# Patient Record
Sex: Female | Born: 1975 | Race: White | Hispanic: No | Marital: Married | Smoking: Never smoker
Health system: Southern US, Community
[De-identification: ages and names within clinical notes are randomized; demographics above are authoritative.]

## PROBLEM LIST (undated history)

## (undated) DIAGNOSIS — K5190 Ulcerative colitis, unspecified, without complications: Secondary | ICD-10-CM

---

## 2014-10-22 ENCOUNTER — Ambulatory Visit
Admission: EM | Admit: 2014-10-22 | Discharge: 2014-10-22 | Disposition: A | Discharge status: Home / Self Care | Attending: Internal Medicine | Admitting: Internal Medicine

## 2014-10-22 DIAGNOSIS — S39012A Strain of muscle, fascia and tendon of lower back, initial encounter: Secondary | ICD-10-CM | POA: Diagnosis not present

## 2014-10-22 DIAGNOSIS — W19XXXA Unspecified fall, initial encounter: Secondary | ICD-10-CM | POA: Diagnosis not present

## 2014-10-22 DIAGNOSIS — Y92009 Unspecified place in unspecified non-institutional (private) residence as the place of occurrence of the external cause: Secondary | ICD-10-CM

## 2014-10-22 DIAGNOSIS — T148 Other injury of unspecified body region: Secondary | ICD-10-CM

## 2014-10-22 DIAGNOSIS — Y92099 Unspecified place in other non-institutional residence as the place of occurrence of the external cause: Secondary | ICD-10-CM

## 2014-10-22 DIAGNOSIS — T148XXA Other injury of unspecified body region, initial encounter: Secondary | ICD-10-CM

## 2014-10-22 DIAGNOSIS — H109 Unspecified conjunctivitis: Secondary | ICD-10-CM | POA: Diagnosis not present

## 2014-10-22 HISTORY — DX: Ulcerative colitis, unspecified, without complications: K51.90

## 2014-10-22 MED ORDER — CYCLOBENZAPRINE HCL 5 MG PO TABS: 5.0000 mg | ORAL_TABLET | Freq: Three times a day (TID) | ORAL | Status: DC | PRN

## 2014-10-22 MED ORDER — ERYTHROMYCIN 5 MG/GM OP OINT: TOPICAL_OINTMENT | OPHTHALMIC | Status: DC

## 2014-10-22 MED ORDER — ACETAMINOPHEN 500 MG PO TABS: 1000.0000 mg | ORAL_TABLET | Freq: Four times a day (QID) | ORAL | Status: AC | PRN

## 2014-10-22 MED ORDER — KETOTIFEN FUMARATE 0.025 % OP SOLN: 1.0000 [drp] | Freq: Two times a day (BID) | OPHTHALMIC | Status: DC

## 2014-10-22 NOTE — Discharge Instructions (Signed)
Contusion A contusion is a deep bruise. Contusions are the result of an injury that caused bleeding under the skin. The contusion may turn blue, purple, or yellow. Minor injuries will give you a painless contusion, but more severe contusions may stay painful and swollen for a few weeks.  CAUSES  A contusion is usually caused by a blow, trauma, or direct force to an area of the body. SYMPTOMS  Swelling and redness of the injured area. Bruising of the injured area. Tenderness and soreness of the injured area. Pain. DIAGNOSIS  The diagnosis can be made by taking a history and physical exam. An X-ray, CT scan, or MRI may be needed to determine if there were any associated injuries, such as fractures. TREATMENT  Specific treatment will depend on what area of the body was injured. In general, the best treatment for a contusion is resting, icing, elevating, and applying cold compresses to the injured area. Over-the-counter medicines may also be recommended for pain control. Ask your caregiver what the best treatment is for your contusion. HOME CARE INSTRUCTIONS  Put ice on the injured area. Put ice in a plastic bag. Place a towel between your skin and the bag. Leave the ice on for 15-20 minutes, 3-4 times a day, or as directed by your health care provider. Only take over-the-counter or prescription medicines for pain, discomfort, or fever as directed by your caregiver. Your caregiver may recommend avoiding anti-inflammatory medicines (aspirin, ibuprofen, and naproxen) for 48 hours because these medicines may increase bruising. Rest the injured area. If possible, elevate the injured area to reduce swelling. SEEK IMMEDIATE MEDICAL CARE IF:  You have increased bruising or swelling. You have pain that is getting worse. Your swelling or pain is not relieved with medicines. MAKE SURE YOU:  Understand these instructions. Will watch your condition. Will get help right away if you are not doing well or  get worse. Document Released: 01/07/2005 Document Revised: 04/04/2013 Document Reviewed: 02/02/2011 [facility] Patient Information 2015 [place], [place]. This information is not intended to replace advice given to you by your health care provider. Make sure you discuss any questions you have with your health care provider. Glaucoma Glaucoma is a condition of high pressure inside the eyes. There are several forms of glaucoma. The pressure in the eye is raised because fluid (aqueous) within the eye can not get out through the normal drainage system. Below are the different forms of glaucoma.  Chronic open-angle glaucoma. This is when the fluid in the eye cannot get through the drainage system. It usually affects both eyes. The best way to understand chronic open angle glaucoma is to think of the tires on your car. When they are overinflated with air, the driver is not aware that there is a problem. However, the rubber on the tires is being worn out faster than normal. Similarly, chronic high pressure in the eyes causes the nerves of the retina and optic nerve to be damaged. When this happens slowly and over time, there are no symptoms.  Closed angle glaucoma or acute angle closure glaucoma. This is when the fluid in the eye cannot get to the drainage system. The pressure in the eye rises very high (3 to 4 times normal). This causes extreme pain and vision loss in the affected eye. Attacks are sudden and often happen in one eye at a time. However, they can happen in both at the same time. If there is an attack of acute angle closure in one eye, the risk  of an attack in the other eye is very high.  Primary glaucoma. This term is used when glaucoma is not caused by another disease. CAUSES  Chronic Open Angle Glaucoma This form is not usually related to other diseases (primary). However, there are forms of this type of glaucoma that are related to other diseases (secondary) including:  Congenital glaucoma.  This means that you are born with the disease.  Certain diseases of the eye (inflammation, some types of cataracts and lens changes, having too much pigment in the eyes, certain diseases of the cornea and tumors of the eye).  Certain drugs and eye drops.  Traumatic injuries.  Abnormal blood vessels that form as a result of other diseases, especially diabetes.  Certain other diseases in the body. Acute Angle Closure Glaucoma   Farsightedness (hyperopia). The eyes are usually shorter in length and have a narrower anatomic opening to the drainage system inside the eye.  Drugs that cause dilation of the pupils as a side affect. Always check the label of drugs for warnings about the risk of glaucoma with their use.  Certain eye drops that can enlarge (dilate) the pupils. This is true even with drops used in the doctor's office to look at your eyes. SYMPTOMS  Chronic Open Angle Glaucoma Early in the disease there are no symptoms at all. If left untreated, the disease will get worse. The following symptoms may happen:   Loss of side vision - usually near the nose. This symptom is rarely noticed by people with chronic open angle glaucoma. By the time this happens, the disease is usually well advanced.  Steady loss of side vision in all directions progressing to "tunnel vision." This is like looking through a toilet roll tube and only being able to see what is in the center of whatever you are looking at.  Eventual total loss of vision in the affected eye(s). Acute Angle Closure Glaucoma  Severe pain in the affected eye associated with clouded vision.  Severe headache in the area around the eye.  Feeling sick to your stomach (nausea) and throwing up (vomiting). DIAGNOSIS  Primary Chronic Open Angle Glaucoma  This form of glaucoma has no symptoms until very late in the disease. Therefore, it is detected only during an eye exam by an eye specialist. It is important to have your eyes looked  at by a specialist at least once a year after the age of 74. If the pressure in the eye is high, it does not necessarily mean that there is glaucoma. In such cases, an ophthalmologist may choose to follow the eye pressures carefully over a period of time. They may not begin treatment until other signs of actual damage appear.  Some of the secondary open angle glaucomas do cause symptoms. In congenital glaucoma for instance, newborn babies' eyes can be enlarged. The baby may cry all of the time due to the pain of the increased pressure.  Acute Angle Closure Glaucoma  People with this type of glaucoma need to get help right away when the attack comes on because it is so painful. The diagnosis is made by an eye exam by an ophthalmologist. He or she will measure the pressure in the affected eye(s). TREATMENT  Primary Chronic Open Angle Glaucoma   Eye drops and medicine by mouth (oral), alone or in combination, may be given. This depends on how bad the disease is and the degree of damage.  Laser treatments may help in more severe cases.  Surgery  may be needed. Acute Angle Closure Glaucoma is treated very aggressively. The longer the attack lasts, the greater the chance of permanent vision loss.   Generally, it is important to get the pressure under control within 24 hours. This is to avoid permanent damage to the affected eye.  Powerful eye drop medication may be used as often as every 10 minutes.  Medicine given by mouth, injection, or through the vein (intravenously). These medicines are used to both open the blockage to the eye's drainage system and to lessen the production of the fluid inside the eye.  After pressure is controlled and to avoid future attacks in both the same eye and the unaffected eye, surgery is needed to make a permanent small opening in the colored portion of the eye (iris). This allows the aqueous fluid to have lasting access to the eye's internal drainage system. This surgery  can be done in the hospital, or it can be performed in an outpatient setting using a laser. SEEK MEDICAL CARE IF:   You are over 61 years of age and have never had your eye pressure measured.  You have sudden, severe pain in one eye associated with drop in vision.  You have a headache, feel sick to your stomach (nausea), and throw up (vomit). SEEK IMMEDIATE MEDICAL CARE IF:  You develop severe pain in the affected eye.  You have problems with your vision.  You have a bad headache in the area around the eye.  You develop nausea and vomiting.  You have the same or similar symptoms in the other eye. Document Released: 03/30/2005 Document Revised: 08/14/2013 Document Reviewed: 11/17/2007 [facility] Patient Information 2015 [place], [place]. This information is not intended to replace advice given to you by your health care provider. Make sure you discuss any questions you have with your health care provider. llergic Conjunctivitis The conjunctiva is a thin membrane that covers the visible white part of the eyeball and the underside of the eyelids. This membrane protects and lubricates the eye. The membrane has small blood vessels running through it that can normally be seen. When the conjunctiva becomes inflamed, the condition is called conjunctivitis. In response to the inflammation, the conjunctival blood vessels become swollen. The swelling results in redness in the normally white part of the eye. The blood vessels of this membrane also react when a person has allergies and is then called allergic conjunctivitis. This condition usually lasts for as long as the allergy persists. Allergic conjunctivitis cannot be passed to another person (non-contagious). The likelihood of bacterial infection is great and the cause is not likely due to allergies if the inflamed eye has:  A sticky discharge.  Discharge or sticking together of the lids in the morning.  Scaling or flaking of the eyelids where the  eyelashes come out.  Red swollen eyelids. CAUSES   Viruses.  Irritants such as foreign bodies.  Chemicals.  General allergic reactions.  Inflammation or serious diseases in the inside or the outside of the eye or the orbit (the boney cavity in which the eye sits) can cause a "red eye." SYMPTOMS   Eye redness.  Tearing.  Itchy eyes.  Burning feeling in the eyes.  Clear drainage from the eye.  Allergic reaction due to pollens or ragweed sensitivity. Seasonal allergic conjunctivitis is frequent in the spring when pollens are in the air and in the fall. DIAGNOSIS  This condition, in its many forms, is usually diagnosed based on the history and an ophthalmological exam.  It usually involves both eyes. If your eyes react at the same time every year, allergies may be the cause. While most "red eyes" are due to allergy or an infection, the role of an eye (ophthalmological) exam is important. The exam can rule out serious diseases of the eye or orbit. TREATMENT   Non-antibiotic eye drops, ointments, or medications by mouth may be prescribed if the ophthalmologist is sure the conjunctivitis is due to allergies alone.  Over-the-counter drops and ointments for allergic symptoms should be used only after other causes of conjunctivitis have been ruled out, or as your caregiver suggests. Medications by mouth are often prescribed if other allergy-related symptoms are present. If the ophthalmologist is sure that the conjunctivitis is due to allergies alone, treatment is normally limited to drops or ointments to reduce itching and burning. HOME CARE INSTRUCTIONS   Wash hands before and after applying drops or ointments, or touching the inflamed eye(s) or eyelids.  Do not let the eye dropper tip or ointment tube touch the eyelid when putting medicine in your eye.  Stop using your soft contact lenses and throw them away. Use a new pair of lenses when recovery is complete. You should run through  sterilizing cycles at least three times before use after complete recovery if the old soft contact lenses are to be used. Hard contact lenses should be stopped. They need to be thoroughly sterilized before use after recovery.  Itching and burning eyes due to allergies is often relieved by using a cool cloth applied to closed eye(s). SEEK MEDICAL CARE IF:   Your problems do not go away after two or three days of treatment.  Your lids are sticky (especially in the morning when you wake up) or stick together.  Discharge develops. Antibiotics may be needed either as drops, ointment, or by mouth.  You have extreme light sensitivity.  An oral temperature above 102 F (38.9 C) develops.  Pain in or around the eye or any other visual symptom develops. MAKE SURE YOU:   Understand these instructions.  Will watch your condition.  Will get help right away if you are not doing well or get worse. Document Released: 06/20/2002 Document Revised: 06/22/2011 Document Reviewed: 05/16/2007 [facility] Patient Information 2015 [place], [place]. This information is not intended to replace advice given to you by your health care provider. Make sure you discuss any questions you have with your health care provider. Low Back Strain with Rehab A strain is an injury in which a tendon or muscle is torn. The muscles and tendons of the lower back are vulnerable to strains. However, these muscles and tendons are very strong and require a great force to be injured. Strains are classified into three categories. Grade 1 strains cause pain, but the tendon is not lengthened. Grade 2 strains include a lengthened ligament, due to the ligament being stretched or partially ruptured. With grade 2 strains there is still function, although the function may be decreased. Grade 3 strains involve a complete tear of the tendon or muscle, and function is usually impaired. SYMPTOMS   Pain in the lower back.  Pain that affects one side  more than the other.  Pain that gets worse with movement and may be felt in the hip, buttocks, or back of the thigh.  Muscle spasms of the muscles in the back.  Swelling along the muscles of the back.  Loss of strength of the back muscles.  Crackling sound (crepitation) when the muscles are touched. CAUSES  Lower back strains occur when a force is placed on the muscles or tendons that is greater than they can handle. Common causes of injury include:  Prolonged overuse of the muscle-tendon units in the lower back, usually from incorrect posture.  A single violent injury or force applied to the back. RISK INCREASES WITH:  Sports that involve twisting forces on the spine or a lot of bending at the waist (football, rugby, weightlifting, bowling, golf, tennis, speed skating, racquetball, swimming, running, gymnastics, diving).  Poor strength and flexibility.  Failure to warm up properly before activity.  Family history of lower back pain or disk disorders.  Previous back injury or surgery (especially fusion).  Poor posture with lifting, especially heavy objects.  Prolonged sitting, especially with poor posture. PREVENTION   Learn and use proper posture when sitting or lifting (maintain proper posture when sitting, lift using the knees and legs, not at the waist).  Warm up and stretch properly before activity.  Allow for adequate recovery between workouts.  Maintain physical fitness:  Strength, flexibility, and endurance.  Cardiovascular fitness. PROGNOSIS  If treated properly, lower back strains usually heal within 6 weeks. RELATED COMPLICATIONS   Recurring symptoms, resulting in a chronic problem.  Chronic inflammation, scarring, and partial muscle-tendon tear.  Delayed healing or resolution of symptoms.  Prolonged disability. TREATMENT  Treatment first involves the use of ice and medicine, to reduce pain and inflammation. The use of strengthening and stretching  exercises may help reduce pain with activity. These exercises may be performed at home or with a therapist. Severe injuries may require referral to a therapist for further evaluation and treatment, such as ultrasound. Your caregiver may advise that you wear a back brace or corset, to help reduce pain and discomfort. Often, prolonged bed rest results in greater harm then benefit. Corticosteroid injections may be recommended. However, these should be reserved for the most serious cases. It is important to avoid using your back when lifting objects. At night, sleep on your back on a firm mattress with a pillow placed under your knees. If non-surgical treatment is unsuccessful, surgery may be needed.  MEDICATION   If pain medicine is needed, nonsteroidal anti-inflammatory medicines (aspirin and ibuprofen), or other minor pain relievers (acetaminophen), are often advised.  Do not take pain medicine for 7 days before surgery.  Prescription pain relievers may be given, if your caregiver thinks they are needed. Use only as directed and only as much as you need.  Ointments applied to the skin may be helpful.  Corticosteroid injections may be given by your caregiver. These injections should be reserved for the most serious cases, because they may only be given a certain number of times. HEAT AND COLD  Cold treatment (icing) should be applied for 10 to 15 minutes every 2 to 3 hours for inflammation and pain, and immediately after activity that aggravates your symptoms. Use ice packs or an ice massage.  Heat treatment may be used before performing stretching and strengthening activities prescribed by your caregiver, physical therapist, or athletic trainer. Use a heat pack or a warm water soak. SEEK MEDICAL CARE IF:   Symptoms get worse or do not improve in 2 to 4 weeks, despite treatment.  You develop numbness, weakness, or loss of bowel or bladder function.  New, unexplained symptoms develop. (Drugs used  in treatment may produce side effects.) EXERCISES  RANGE OF MOTION (ROM) AND STRETCHING EXERCISES - Low Back Strain Most people with lower back pain will find that  their symptoms get worse with excessive bending forward (flexion) or arching at the lower back (extension). The exercises which will help resolve your symptoms will focus on the opposite motion.  Your physician, physical therapist or athletic trainer will help you determine which exercises will be most helpful to resolve your lower back pain. Do not complete any exercises without first consulting with your caregiver. Discontinue any exercises which make your symptoms worse until you speak to your caregiver.  If you have pain, numbness or tingling which travels down into your buttocks, leg or foot, the goal of the therapy is for these symptoms to move closer to your back and eventually resolve. Sometimes, these leg symptoms will get better, but your lower back pain may worsen. This is typically an indication of progress in your rehabilitation. Be very alert to any changes in your symptoms and the activities in which you participated in the 24 hours prior to the change. Sharing this information with your caregiver will allow him/her to most efficiently treat your condition.  These exercises may help you when beginning to rehabilitate your injury. Your symptoms may resolve with or without further involvement from your physician, physical therapist or athletic trainer. While completing these exercises, remember:  Restoring tissue flexibility helps normal motion to return to the joints. This allows healthier, less painful movement and activity.  An effective stretch should be held for at least 30 seconds.  A stretch should never be painful. You should only feel a gentle lengthening or release in the stretched tissue. FLEXION RANGE OF MOTION AND STRETCHING EXERCISES: STRETCH - Flexion, Single Knee to Chest   Lie on a firm bed or floor with both  legs extended in front of you.  Keeping one leg in contact with the floor, bring your opposite knee to your chest. Hold your leg in place by either grabbing behind your thigh or at your knee.  Pull until you feel a gentle stretch in your lower back. Hold __________ seconds.  Slowly release your grasp and repeat the exercise with the opposite side. Repeat __________ times. Complete this exercise __________ times per day.  STRETCH - Flexion, Double Knee to Chest   Lie on a firm bed or floor with both legs extended in front of you.  Keeping one leg in contact with the floor, bring your opposite knee to your chest.  Tense your stomach muscles to support your back and then lift your other knee to your chest. Hold your legs in place by either grabbing behind your thighs or at your knees.  Pull both knees toward your chest until you feel a gentle stretch in your lower back. Hold __________ seconds.  Tense your stomach muscles and slowly return one leg at a time to the floor. Repeat __________ times. Complete this exercise __________ times per day.  STRETCH - Low Trunk Rotation  Lie on a firm bed or floor. Keeping your legs in front of you, bend your knees so they are both pointed toward the ceiling and your feet are flat on the floor.  Extend your arms out to the side. This will stabilize your upper body by keeping your shoulders in contact with the floor.  Gently and slowly drop both knees together to one side until you feel a gentle stretch in your lower back. Hold for __________ seconds.  Tense your stomach muscles to support your lower back as you bring your knees back to the starting position. Repeat the exercise to the other side. Repeat  __________ times. Complete this exercise __________ times per day  EXTENSION RANGE OF MOTION AND FLEXIBILITY EXERCISES: STRETCH - Extension, Prone on Elbows   Lie on your stomach on the floor, a bed will be too soft. Place your palms about shoulder  width apart and at the height of your head.  Place your elbows under your shoulders. If this is too painful, stack pillows under your chest.  Allow your body to relax so that your hips drop lower and make contact more completely with the floor.  Hold this position for __________ seconds.  Slowly return to lying flat on the floor. Repeat __________ times. Complete this exercise __________ times per day.  RANGE OF MOTION - Extension, Prone Press Ups  Lie on your stomach on the floor, a bed will be too soft. Place your palms about shoulder width apart and at the height of your head.  Keeping your back as relaxed as possible, slowly straighten your elbows while keeping your hips on the floor. You may adjust the placement of your hands to maximize your comfort. As you gain motion, your hands will come more underneath your shoulders.  Hold this position __________ seconds.  Slowly return to lying flat on the floor. Repeat __________ times. Complete this exercise __________ times per day.  RANGE OF MOTION- Quadruped, Neutral Spine   Assume a hands and knees position on a firm surface. Keep your hands under your shoulders and your knees under your hips. You may place padding under your knees for comfort.  Drop your head and point your tail bone toward the ground below you. This will round out your lower back like an angry cat. Hold this position for __________ seconds.  Slowly lift your head and release your tail bone so that your back sags into a large arch, like an old horse.  Hold this position for __________ seconds.  Repeat this until you feel limber in your lower back.  Now, find your "sweet spot." This will be the most comfortable position somewhere between the two previous positions. This is your neutral spine. Once you have found this position, tense your stomach muscles to support your lower back.  Hold this position for __________ seconds. Repeat __________ times. Complete this  exercise __________ times per day.  STRENGTHENING EXERCISES - Low Back Strain These exercises may help you when beginning to rehabilitate your injury. These exercises should be done near your "sweet spot." This is the neutral, low-back arch, somewhere between fully rounded and fully arched, that is your least painful position. When performed in this safe range of motion, these exercises can be used for people who have either a flexion or extension based injury. These exercises may resolve your symptoms with or without further involvement from your physician, physical therapist or athletic trainer. While completing these exercises, remember:   Muscles can gain both the endurance and the strength needed for everyday activities through controlled exercises.  Complete these exercises as instructed by your physician, physical therapist or athletic trainer. Increase the resistance and repetitions only as guided.  You may experience muscle soreness or fatigue, but the pain or discomfort you are trying to eliminate should never worsen during these exercises. If this pain does worsen, stop and make certain you are following the directions exactly. If the pain is still present after adjustments, discontinue the exercise until you can discuss the trouble with your caregiver. STRENGTHENING - Deep Abdominals, Pelvic Tilt  Lie on a firm bed or floor. Keeping your legs  in front of you, bend your knees so they are both pointed toward the ceiling and your feet are flat on the floor.  Tense your lower abdominal muscles to press your lower back into the floor. This motion will rotate your pelvis so that your tail bone is scooping upwards rather than pointing at your feet or into the floor.  With a gentle tension and even breathing, hold this position for __________ seconds. Repeat __________ times. Complete this exercise __________ times per day.  STRENGTHENING - Abdominals, Crunches   Lie on a firm bed or floor.  Keeping your legs in front of you, bend your knees so they are both pointed toward the ceiling and your feet are flat on the floor. Cross your arms over your chest.  Slightly tip your chin down without bending your neck.  Tense your abdominals and slowly lift your trunk high enough to just clear your shoulder blades. Lifting higher can put excessive stress on the lower back and does not further strengthen your abdominal muscles.  Control your return to the starting position. Repeat __________ times. Complete this exercise __________ times per day.  STRENGTHENING - Quadruped, Opposite UE/LE Lift   Assume a hands and knees position on a firm surface. Keep your hands under your shoulders and your knees under your hips. You may place padding under your knees for comfort.  Find your neutral spine and gently tense your abdominal muscles so that you can maintain this position. Your shoulders and hips should form a rectangle that is parallel with the floor and is not twisted.  Keeping your trunk steady, lift your right hand no higher than your shoulder and then your left leg no higher than your hip. Make sure you are not holding your breath. Hold this position __________ seconds.  Continuing to keep your abdominal muscles tense and your back steady, slowly return to your starting position. Repeat with the opposite arm and leg. Repeat __________ times. Complete this exercise __________ times per day.  STRENGTHENING - Lower Abdominals, Double Knee Lift  Lie on a firm bed or floor. Keeping your legs in front of you, bend your knees so they are both pointed toward the ceiling and your feet are flat on the floor.  Tense your abdominal muscles to brace your lower back and slowly lift both of your knees until they come over your hips. Be certain not to hold your breath.  Hold __________ seconds. Using your abdominal muscles, return to the starting position in a slow and controlled manner. Repeat __________  times. Complete this exercise __________ times per day.  POSTURE AND BODY MECHANICS CONSIDERATIONS - Low Back Strain Keeping correct posture when sitting, standing or completing your activities will reduce the stress put on different body tissues, allowing injured tissues a chance to heal and limiting painful experiences. The following are general guidelines for improved posture. Your physician or physical therapist will provide you with any instructions specific to your needs. While reading these guidelines, remember:  The exercises prescribed by your provider will help you have the flexibility and strength to maintain correct postures.  The correct posture provides the best environment for your joints to work. All of your joints have less wear and tear when properly supported by a spine with good posture. This means you will experience a healthier, less painful body.  Correct posture must be practiced with all of your activities, especially prolonged sitting and standing. Correct posture is as important when doing repetitive low-stress activities (typing)  as it is when doing a single heavy-load activity (lifting). RESTING POSITIONS Consider which positions are most painful for you when choosing a resting position. If you have pain with flexion-based activities (sitting, bending, stooping, squatting), choose a position that allows you to rest in a less flexed posture. You would want to avoid curling into a fetal position on your side. If your pain worsens with extension-based activities (prolonged standing, working overhead), avoid resting in an extended position such as sleeping on your stomach. Most people will find more comfort when they rest with their spine in a more neutral position, neither too rounded nor too arched. Lying on a non-sagging bed on your side with a pillow between your knees, or on your back with a pillow under your knees will often provide some relief. Keep in mind, being in any one  position for a prolonged period of time, no matter how correct your posture, can still lead to stiffness. PROPER SITTING POSTURE In order to minimize stress and discomfort on your spine, you must sit with correct posture. Sitting with good posture should be effortless for a healthy body. Returning to good posture is a gradual process. Many people can work toward this most comfortably by using various supports until they have the flexibility and strength to maintain this posture on their own. When sitting with proper posture, your ears will fall over your shoulders and your shoulders will fall over your hips. You should use the back of the chair to support your upper back. Your lower back will be in a neutral position, just slightly arched. You may place a small pillow or folded towel at the base of your lower back for support.  When working at a desk, create an environment that supports good, upright posture. Without extra support, muscles tire, which leads to excessive strain on joints and other tissues. Keep these recommendations in mind: CHAIR:  A chair should be able to slide under your desk when your back makes contact with the back of the chair. This allows you to work closely.  The chair's height should allow your eyes to be level with the upper part of your monitor and your hands to be slightly lower than your elbows. BODY POSITION  Your feet should make contact with the floor. If this is not possible, use a foot rest.  Keep your ears over your shoulders. This will reduce stress on your neck and lower back. INCORRECT SITTING POSTURES  If you are feeling tired and unable to assume a healthy sitting posture, do not slouch or slump. This puts excessive strain on your back tissues, causing more damage and pain. Healthier options include:  Using more support, like a lumbar pillow.  Switching tasks to something that requires you to be upright or walking.  Talking a brief walk.  Lying down  to rest in a neutral-spine position. PROLONGED STANDING WHILE SLIGHTLY LEANING FORWARD  When completing a task that requires you to lean forward while standing in one place for a long time, place either foot up on a stationary 2-4 inch high object to help maintain the best posture. When both feet are on the ground, the lower back tends to lose its slight inward curve. If this curve flattens (or becomes too large), then the back and your other joints will experience too much stress, tire more quickly, and can cause pain. CORRECT STANDING POSTURES Proper standing posture should be assumed with all daily activities, even if they only take a few moments,  like when brushing your teeth. As in sitting, your ears should fall over your shoulders and your shoulders should fall over your hips. You should keep a slight tension in your abdominal muscles to brace your spine. Your tailbone should point down to the ground, not behind your body, resulting in an over-extended swayback posture.  INCORRECT STANDING POSTURES  Common incorrect standing postures include a forward head, locked knees and/or an excessive swayback. WALKING Walk with an upright posture. Your ears, shoulders and hips should all line-up. PROLONGED ACTIVITY IN A FLEXED POSITION When completing a task that requires you to bend forward at your waist or lean over a low surface, try to find a way to stabilize 3 out of 4 of your limbs. You can place a hand or elbow on your thigh or rest a knee on the surface you are reaching across. This will provide you more stability so that your muscles do not fatigue as quickly. By keeping your knees relaxed, or slightly bent, you will also reduce stress across your lower back. CORRECT LIFTING TECHNIQUES DO :   Assume a wide stance. This will provide you more stability and the opportunity to get as close as possible to the object which you are lifting.  Tense your abdominals to brace your spine. Bend at the knees  and hips. Keeping your back locked in a neutral-spine position, lift using your leg muscles. Lift with your legs, keeping your back straight.  Test the weight of unknown objects before attempting to lift them.  Try to keep your elbows locked down at your sides in order get the best strength from your shoulders when carrying an object.  Always ask for help when lifting heavy or awkward objects. INCORRECT LIFTING TECHNIQUES DO NOT:   Lock your knees when lifting, even if it is a small object.  Bend and twist. Pivot at your feet or move your feet when needing to change directions.  Assume that you can safely pick up even a paper clip without proper posture. Document Released: 03/30/2005 Document Revised: 06/22/2011 Document Reviewed: 07/12/2008 [facility] Patient Information 2015 [place], [place]. This information is not intended to replace advice given to you by your health care provider. Make sure you discuss any questions you have with your health care provider.

## 2014-10-22 NOTE — ED Provider Notes (Signed)
CSN: [id number]     Arrival date & time 10/22/14  4098 History   First MD Initiated Contact with Patient 10/22/14 1031     Chief Complaint  Patient presents with  . Eye Pain   (Consider location/radiation/quality/duration/timing/severity/associated sxs/prior Treatment) HPI Comments: Caucasian female moving and unpacking at new home tripped and fell onto boxes yesterday having lower right back pain.  Denied loss of bowel/bladder control, saddle paresthesias, leg/arm weakness.  Has also had intermittent right eyeball discomfort restarted last night denied vision changes has seen optometry in past year and exam was normal  Patient is a 39 y.o. female presenting with eye pain. The history is provided by the patient.  Eye Pain This is a new problem. The current episode started more than 2 days ago. The problem occurs constantly. The problem has not changed since onset.Pertinent negatives include no chest pain, no abdominal pain, no headaches and no shortness of breath. The symptoms are aggravated by exertion. Nothing relieves the symptoms. She has tried a cold compress, rest, food and water for the symptoms. The treatment provided no relief.    Past Medical History  Diagnosis Date  . Ulcerative colitis    History reviewed. No pertinent past surgical history. History reviewed. No pertinent family history. History  Substance Use Topics  . Smoking status: Never Smoker   . Smokeless tobacco: Not on file  . Alcohol Use: No   OB History    No data available     Review of Systems  Constitutional: Negative for fever, chills, diaphoresis, activity change, appetite change and fatigue.  HENT: Negative for congestion, drooling, ear discharge, ear pain, facial swelling, mouth sores, nosebleeds, postnasal drip, rhinorrhea, sinus pressure, sneezing, sore throat, tinnitus, trouble swallowing and voice change.   Eyes: Positive for pain. Negative for photophobia, discharge, redness, itching and visual  disturbance.  Respiratory: Negative for cough, shortness of breath, wheezing and stridor.   Cardiovascular: Negative for chest pain, palpitations and leg swelling.  Gastrointestinal: Negative for nausea, vomiting, abdominal pain, diarrhea, constipation and blood in stool.  Endocrine: Negative for cold intolerance and heat intolerance.  Genitourinary: Negative for dysuria, hematuria and difficulty urinating.  Musculoskeletal: Positive for myalgias and back pain. Negative for joint swelling, arthralgias, gait problem, neck pain and neck stiffness.  Skin: Positive for color change. Negative for pallor, rash and wound.  Allergic/Immunologic: Positive for environmental allergies. Negative for food allergies.  Neurological: Negative for dizziness, tremors, seizures, syncope, facial asymmetry, speech difficulty, weakness, light-headedness, numbness and headaches.  Hematological: Negative for adenopathy. Does not bruise/bleed easily.  Psychiatric/Behavioral: Negative for behavioral problems, confusion, sleep disturbance and agitation.    Allergies  Review of patient's allergies indicates no known allergies.  Home Medications   Prior to Admission medications   Medication Sig Start Date End Date Taking? Authorizing Provider  Mesalamine (LIALDA PO) Take by mouth.   Yes Historical Provider, MD  predniSONE (DELTASONE) 20 MG tablet Take 20 mg by mouth daily with breakfast.   Yes Historical Provider, MD  acetaminophen (TYLENOL) 500 MG tablet Take 2 tablets (1,000 mg total) by mouth every 6 (six) hours as needed for moderate pain. 10/22/14   Barbaraann Barthel, NP  cyclobenzaprine (FLEXERIL) 5 MG tablet Take 1 tablet (5 mg total) by mouth 3 (three) times daily as needed for muscle spasms. 10/22/14   Barbaraann Barthel, NP  erythromycin ophthalmic ointment Place a 1/2 inch ribbon of ointment into the lower eyelid right eye BID 10/22/14   Barbaraann Barthel, NP  ketotifen (ZADITOR) 0.025 % ophthalmic solution  Place 1 drop into the right eye 2 (two) times daily. 10/22/14   Jarold Songina A Nohely Whitehorn, NP   BP 122/79 mmHg  Pulse 75  Temp(Src) 98.4 F (36.9 C) (Tympanic)  Resp 18  Ht 5\' 7"  (1.702 m)  Wt 160 lb (72.576 kg)  BMI 25.05 kg/m2  SpO2 100% Physical Exam  Constitutional: She is oriented to person, place, and time. Vital signs are normal. She appears well-developed and well-nourished. No distress.  HENT:  Head: Normocephalic and atraumatic.  Right Ear: Hearing, external ear and ear canal normal. A middle ear effusion is present.  Left Ear: Hearing, external ear and ear canal normal. A middle ear effusion is present.  Nose: Mucosal edema and rhinorrhea present. Right sinus exhibits no maxillary sinus tenderness and no frontal sinus tenderness. Left sinus exhibits no maxillary sinus tenderness and no frontal sinus tenderness.  Mouth/Throat: Uvula is midline and mucous membranes are normal. She does not have dentures. No oral lesions. No trismus in the jaw. Normal dentition. No dental abscesses, uvula swelling, lacerations or dental caries. Posterior oropharyngeal edema and posterior oropharyngeal erythema present. No oropharyngeal exudate or tonsillar abscesses.  Eyes: EOM and lids are normal. Pupils are equal, round, and reactive to light. Right eye exhibits no chemosis, no discharge, no exudate and no hordeolum. No foreign body present in the right eye. Left eye exhibits no chemosis, no discharge, no exudate and no hordeolum. No foreign body present in the left eye. Right conjunctiva is injected. Right conjunctiva has no hemorrhage. Left conjunctiva is injected. Left conjunctiva has no hemorrhage. No scleral icterus. Right eye exhibits normal extraocular motion and no nystagmus. Left eye exhibits normal extraocular motion and no nystagmus. Right pupil is round and reactive. Left pupil is round and reactive. Pupils are equal.  Eyelid conjunctiva 2+/4 injection bilaterally upper and lower eyelids  Neck:  Trachea normal and normal range of motion. Neck supple. No tracheal deviation present. No thyromegaly present.  Cardiovascular: Normal rate, regular rhythm, normal heart sounds and intact distal pulses.  Exam reveals no gallop and no friction rub.   No murmur heard. Pulmonary/Chest: Effort normal and breath sounds normal. No stridor. No respiratory distress. She has no wheezes.  Abdominal: Soft. Bowel sounds are normal. She exhibits no distension and no mass. There is no tenderness. There is no rebound and no guarding.  Musculoskeletal: Normal range of motion. She exhibits tenderness. She exhibits no edema.       Cervical back: She exhibits normal range of motion, no tenderness, no bony tenderness, no swelling, no edema, no deformity, no laceration, no pain, no spasm and normal pulse.       Thoracic back: She exhibits normal range of motion, no tenderness, no bony tenderness, no swelling, no edema, no deformity, no laceration, no pain, no spasm and normal pulse.       Lumbar back: She exhibits tenderness, pain and spasm. She exhibits normal range of motion, no bony tenderness, no swelling, no edema, no deformity, no laceration and normal pulse.  Pain with rotation, lateral bending and flexion right lumbar paraspinals, spasm noted T12; slightly TTP right SI joint also  Lymphadenopathy:    She has no cervical adenopathy.  Neurological: She is alert and oriented to person, place, and time. She has normal reflexes. She displays normal reflexes. No cranial nerve deficit. She exhibits normal muscle tone. Coordination normal.  Skin: Skin is warm, dry and intact. Bruising and ecchymosis noted. No abrasion, no  burn, no laceration, no lesion, no petechiae and no rash noted. She is not diaphoretic. No cyanosis or erythema. No pallor. Nails show no clubbing.     Psychiatric: She has a normal mood and affect. Her speech is normal and behavior is normal. Judgment and thought content normal. Cognition and memory  are normal.  Nursing note and vitals reviewed.   ED Course  Procedures (including critical care time) Labs Review Labs Reviewed - No data to display  Imaging Review No results found.   MDM   1. Conjunctivitis of right eye   2. Low back strain, initial encounter   3. Fall at home, initial encounter   4. Contusion   Cleared for work/daycare/school.  Note given.  Start antihistamine medication.  Shower after allergen exposure prior to bed.  Hygiene discussed. Apply erythromycin opthalmic ointment twice a day for one week.  If no improvement in symptoms after one week of medication schedule follow up with optometry unless swelling noted in eyelids, erythema, changes in vision then go to ER for re-evaluation day that symptoms occur.  Patient to apply warm packs prn as directed.  Instructed patient to not rub eyes.  May use over the counter eye drops/tears for pain/symptom relief.  Return to clinic if headache, fever greater than 100.54F, nausea/vomiting, purulent discharge/matting unable to open eye without using fingers, foreign body sensation, ciliary flush, worsening photophobia or vision.  Call or return to clinic as needed if these symptoms worsen or fail to improve as anticipated.  Patient given [employer] handout on allergic conjunctivitis.  Patient verbalized agreement and understanding of treatment plan.   P2:  Hand washing, avoid rubbing eyes  For acute pain, rest, and intermittent application of heat (do not sleep on heating pad).  I discussed longer-term treatment plan of PRN PO NSAIDS and I discussed a home back care exercise program with a strengthening and flexibility exercise.  Discussed no alcohol consumption if taking flexeril and avoid driving for 8 hours after taking medication. Work excuse for 24 hours.  May take tylenol 1000mg  po QID prn pain.  Discussed motrin or naproxen could worsen ulcerative colitis.  Patient given [employer] handout on back strain and contusion right leg.   Proper avoidance of heavy lifting discussed.  Consider physical therapy or chiropractic care and radiology if not improving.  Call or return to clinic as needed if these symptoms worsen or fail to improve as anticipated especially leg weakness, loss of bowel/bladder control or saddle paresthesias.   Patient verbalized understanding of instructions/information and agreed with plan of care.  P2:  Injury Prevention, fitness    Barbaraann Barthel, NP 10/22/14 1653

## 2014-10-22 NOTE — ED Notes (Signed)
Here for (1) Right eye pain started last night, pain is constant, no redness or discharge, L 20/15, R20/20, Both 20/15.  (2) Back pain, was moving and fell, believe the fall aggravated the back pain

## 2015-01-31 ENCOUNTER — Ambulatory Visit

## 2015-01-31 ENCOUNTER — Ambulatory Visit
Admission: EM | Admit: 2015-01-31 | Discharge: 2015-01-31 | Disposition: A | Discharge status: Home / Self Care | Attending: Family Medicine | Admitting: Family Medicine

## 2015-01-31 ENCOUNTER — Encounter: Payer: Self-pay | Admitting: Emergency Medicine

## 2015-01-31 DIAGNOSIS — R109 Unspecified abdominal pain: Secondary | ICD-10-CM | POA: Diagnosis not present

## 2015-01-31 LAB — COMPREHENSIVE METABOLIC PANEL
ALBUMIN: 4.5 g/dL (ref 3.5–5.0)
ALT: 16 U/L (ref 14–54)
ANION GAP: 8 (ref 5–15)
AST: 16 U/L (ref 15–41)
Alkaline Phosphatase: 82 U/L (ref 38–126)
BILIRUBIN TOTAL: 0.6 mg/dL (ref 0.3–1.2)
BUN: 9 mg/dL (ref 6–20)
CALCIUM: 9.2 mg/dL (ref 8.9–10.3)
CO2: 30 mmol/L (ref 22–32)
Chloride: 101 mmol/L (ref 101–111)
Creatinine, Ser: 0.62 mg/dL (ref 0.44–1.00)
GFR calc Af Amer: >60 mL/min (ref >60)
GFR calc non Af Amer: >60 mL/min (ref >60)
Glucose, Bld: 103 mg/dL — ABNORMAL HIGH (ref 65–99)
Potassium: 4 mmol/L (ref 3.5–5.1)
Sodium: 139 mmol/L (ref 135–145)
TOTAL PROTEIN: 7.8 g/dL (ref 6.5–8.1)

## 2015-01-31 LAB — CBC WITH DIFFERENTIAL/PLATELET
BASOS PCT: 1 %
Basophils Absolute: 0.1 10*3/uL (ref 0–0.1)
Eosinophils Absolute: 0.2 10*3/uL (ref 0–0.7)
Eosinophils Relative: 2 %
HEMATOCRIT: 44.1 % (ref 35.0–47.0)
Hemoglobin: 14.7 g/dL (ref 12.0–16.0)
Lymphocytes Relative: 31 %
Lymphs Abs: 2.4 10*3/uL (ref 1.0–3.6)
MCH: 29.5 pg (ref 26.0–34.0)
MCHC: 33.4 g/dL (ref 32.0–36.0)
MCV: 88.3 fL (ref 80.0–100.0)
MONO ABS: 0.7 10*3/uL (ref 0.2–0.9)
Monocytes Relative: 9 %
Neutro Abs: 4.5 10*3/uL (ref 1.4–6.5)
Neutrophils Relative %: 57 %
Platelets: 275 10*3/uL (ref 150–440)
RBC: 4.99 MIL/uL (ref 3.80–5.20)
RDW: 12.4 % (ref 11.5–14.5)
WBC: 7.7 10*3/uL (ref 3.6–11.0)

## 2015-01-31 LAB — URINALYSIS COMPLETE WITH MICROSCOPIC (ARMC ONLY)
BILIRUBIN URINE: NEGATIVE
Glucose, UA: NEGATIVE mg/dL
Hgb urine dipstick: NEGATIVE
KETONES UR: NEGATIVE mg/dL
LEUKOCYTES UA: NEGATIVE
NITRITE: NEGATIVE
PROTEIN: NEGATIVE mg/dL
WBC, UA: NONE SEEN WBC/hpf (ref <3)
pH: 5.5 (ref 5.0–8.0)

## 2015-01-31 LAB — PREGNANCY, URINE: Preg Test, Ur: NEGATIVE

## 2015-01-31 MED ORDER — HYDROCODONE-ACETAMINOPHEN 5-325 MG PO TABS: 1.0000 | ORAL_TABLET | Freq: Four times a day (QID) | ORAL | Status: DC | PRN

## 2015-01-31 MED ORDER — TAMSULOSIN HCL 0.4 MG PO CAPS: 0.4000 mg | ORAL_CAPSULE | Freq: Every day | ORAL | Status: AC

## 2015-01-31 MED ORDER — ONDANSETRON 8 MG PO TBDP
8.0000 mg | ORAL_TABLET | Freq: Once | ORAL | Status: AC
Start: 2015-01-31 — End: 2015-01-31
  Administered 2015-01-31: 8 mg via ORAL

## 2015-01-31 MED ORDER — ONDANSETRON 8 MG PO TBDP: 8.0000 mg | ORAL_TABLET | Freq: Two times a day (BID) | ORAL | Status: AC

## 2015-01-31 MED ORDER — KETOROLAC TROMETHAMINE 10 MG PO TABS: 10.0000 mg | ORAL_TABLET | Freq: Three times a day (TID) | ORAL | Status: AC | PRN

## 2015-01-31 NOTE — ED Notes (Signed)
Patient c/o right sided back pain since yesterday.  Patient c/o N/V.

## 2015-01-31 NOTE — ED Provider Notes (Signed)
CSN: [id number]     Arrival date & time 01/31/15  1045 History   First MD Initiated Contact with Patient 01/31/15 1144     Chief Complaint  Patient presents with  . Back Pain   (Consider location/radiation/quality/duration/timing/severity/associated sxs/prior Treatment) HPI Comments: Married caucasian female here for evaluation of right flank pain started last night took tylenol 1000mg  po and went to bed pain woke her up in middle of night and she threw up still nauseaus.  History of kidney stones.  Saw GI beginning of the month for her colitis.  Patient reported this feels like her typical kidney stone.  + Headache and dizzyness urine slightly blood tinged per patient  Patient is a 39 y.o. female presenting with back pain. The history is provided by the patient.  Back Pain Quality:  Burning Radiates to:  Does not radiate Pain severity:  Severe Pain is:  Same all the time Onset quality:  Sudden Duration:  18 hours Timing:  Constant Progression:  Unchanged Chronicity:  Recurrent Context: emotional stress and recent illness   Context: not falling, not jumping from heights, not lifting heavy objects, not MCA, not MVA, not occupational injury, not pedestrian accident, not physical stress, not recent injury and not twisting   Relieved by:  Nothing Worsened by:  Movement Ineffective treatments:  Bed rest, being still, lying down, OTC medications and NSAIDs Associated symptoms: headaches   Associated symptoms: no abdominal pain, no abdominal swelling, no bladder incontinence, no bowel incontinence, no chest pain, no dysuria, no fever, no leg pain, no numbness, no paresthesias, no pelvic pain, no perianal numbness, no tingling, no weakness and no weight loss   Risk factors: no hx of cancer, no hx of osteoporosis, no menopause, not obese, not pregnant, no recent surgery and no vascular disease     Past Medical History  Diagnosis Date  . Ulcerative colitis (HCC)    History reviewed. No  pertinent past surgical history. History reviewed. No pertinent family history. Social History  Substance Use Topics  . Smoking status: Never Smoker   . Smokeless tobacco: None  . Alcohol Use: No   OB History    No data available     Review of Systems  Constitutional: Positive for fatigue. Negative for fever, chills, weight loss, diaphoresis, activity change, appetite change and unexpected weight change.  HENT: Negative for congestion, dental problem, drooling, ear discharge, ear pain, facial swelling, hearing loss, mouth sores, nosebleeds, postnasal drip, rhinorrhea, sinus pressure, sneezing, sore throat, tinnitus, trouble swallowing and voice change.   Eyes: Negative for photophobia, pain, discharge, redness, itching and visual disturbance.  Respiratory: Negative for cough, choking, chest tightness, shortness of breath, wheezing and stridor.   Cardiovascular: Negative for chest pain, palpitations and leg swelling.  Gastrointestinal: Negative for nausea, vomiting, abdominal pain, diarrhea, constipation, blood in stool, abdominal distention, rectal pain and bowel incontinence.  Endocrine: Negative for cold intolerance and heat intolerance.  Genitourinary: Positive for hematuria and flank pain. Negative for bladder incontinence, dysuria, urgency, frequency, decreased urine volume, vaginal bleeding, vaginal discharge, enuresis, difficulty urinating, genital sores, vaginal pain, menstrual problem and pelvic pain.  Musculoskeletal: Positive for myalgias and back pain. Negative for joint swelling, arthralgias, gait problem, neck pain and neck stiffness.  Skin: Negative for color change, pallor, rash and wound.  Allergic/Immunologic: Positive for immunocompromised state. Negative for environmental allergies and food allergies.  Neurological: Positive for dizziness and headaches. Negative for tingling, tremors, seizures, syncope, facial asymmetry, speech difficulty, weakness, light-headedness,  numbness and paresthesias.  Hematological: Negative for adenopathy. Does not bruise/bleed easily.  Psychiatric/Behavioral: Positive for sleep disturbance. Negative for behavioral problems, confusion and agitation.    Allergies  Codeine and Tramadol  Home Medications   Prior to Admission medications   Medication Sig Start Date End Date Taking? Authorizing Provider  Adalimumab (HUMIRA) 40 MG/0.8ML PSKT Inject 40 mg into the skin every 21 ( twenty-one) days.   Yes Historical Provider, MD  acetaminophen (TYLENOL) 500 MG tablet Take 2 tablets (1,000 mg total) by mouth every 6 (six) hours as needed for moderate pain. 10/22/14   Barbaraann Barthel, NP  ketorolac (TORADOL) 10 MG tablet Take 1 tablet (10 mg total) by mouth every 8 (eight) hours as needed for severe pain. 01/31/15   Barbaraann Barthel, NP  Mesalamine (LIALDA PO) Take by mouth.    Historical Provider, MD  ondansetron (ZOFRAN-ODT) 8 MG disintegrating tablet Take 1 tablet (8 mg total) by mouth 2 (two) times daily. 01/31/15   Barbaraann Barthel, NP  tamsulosin (FLOMAX) 0.4 MG CAPS capsule Take 1 capsule (0.4 mg total) by mouth daily. 01/31/15   Barbaraann Barthel, NP   Meds Ordered and Administered this Visit   Medications  ondansetron (ZOFRAN-ODT) disintegrating tablet 8 mg (8 mg Oral Given 01/31/15 1145)    BP 115/77 mmHg  Pulse 65  Temp(Src) 98 F (36.7 C) (Tympanic)  Resp 16  Ht 5\' 7"  (1.702 m)  Wt 158 lb (71.668 kg)  BMI 24.74 kg/m2  SpO2 100%  LMP 01/24/2015 (Approximate) Orthostatic VS for the past 24 hrs:  BP- Lying Pulse- Lying BP- Sitting Pulse- Sitting BP- Standing at 0 minutes Pulse- Standing at 0 minutes  01/31/15 1201 113/80 mmHg 63 111/73 mmHg 73 96/72 mmHg 72    Physical Exam  Constitutional: She is oriented to person, place, and time. Vital signs are normal. She appears well-developed and well-nourished. She is active.  Non-toxic appearance. She does not have a sickly appearance. She appears ill. No distress.    HENT:  Head: Normocephalic and atraumatic.  Right Ear: Hearing, tympanic membrane, external ear and ear canal normal.  Left Ear: Hearing, tympanic membrane, external ear and ear canal normal.  Nose: Nose normal. No mucosal edema, rhinorrhea, nose lacerations, sinus tenderness, nasal deformity, septal deviation or nasal septal hematoma. No epistaxis.  No foreign bodies. Right sinus exhibits no maxillary sinus tenderness and no frontal sinus tenderness. Left sinus exhibits no maxillary sinus tenderness and no frontal sinus tenderness.  Mouth/Throat: Uvula is midline, oropharynx is clear and moist and mucous membranes are normal. Mucous membranes are not pale, not dry and not cyanotic. She does not have dentures. No oral lesions. No trismus in the jaw. Normal dentition. No dental abscesses, uvula swelling, lacerations or dental caries. No oropharyngeal exudate, posterior oropharyngeal edema, posterior oropharyngeal erythema or tonsillar abscesses.  Eyes: Conjunctivae, EOM and lids are normal. Pupils are equal, round, and reactive to light. Right eye exhibits no chemosis, no discharge, no exudate and no hordeolum. No foreign body present in the right eye. Left eye exhibits no chemosis, no discharge, no exudate and no hordeolum. No foreign body present in the left eye. Right conjunctiva is not injected. Right conjunctiva has no hemorrhage. Left conjunctiva is not injected. Left conjunctiva has no hemorrhage. No scleral icterus. Right eye exhibits normal extraocular motion and no nystagmus. Left eye exhibits normal extraocular motion and no nystagmus. Right pupil is round and reactive. Left pupil is round and reactive. Pupils are equal.  Neck: Trachea normal  and normal range of motion. Neck supple. No spinous process tenderness and no muscular tenderness present. No rigidity. No tracheal deviation, no edema, no erythema and normal range of motion present. No thyroid mass and no thyromegaly present.   Cardiovascular: Normal rate, regular rhythm, S1 normal, S2 normal, normal heart sounds and intact distal pulses.  PMI is not displaced.  Exam reveals no gallop and no friction rub.   No murmur heard. Pulmonary/Chest: Effort normal and breath sounds normal. No accessory muscle usage or stridor. No respiratory distress. She has no decreased breath sounds. She has no wheezes. She has no rhonchi. She has no rales. She exhibits no tenderness.  Abdominal: Soft. Bowel sounds are normal. She exhibits no shifting dullness, no distension, no pulsatile liver, no fluid wave, no abdominal bruit, no ascites, no pulsatile midline mass and no mass. There is no hepatosplenomegaly. There is tenderness in the suprapubic area. There is no rigidity, no rebound, no guarding, no CVA tenderness, no tenderness at McBurney's point and negative Murphy's sign. Hernia confirmed negative in the ventral area.  Dull to percussion x 4 quads  Musculoskeletal: Normal range of motion. She exhibits no edema or tenderness.       Right shoulder: Normal.       Left shoulder: Normal.       Cervical back: Normal.       Thoracic back: She exhibits pain. She exhibits normal range of motion, no tenderness, no bony tenderness, no swelling, no edema, no deformity, no laceration, no spasm and normal pulse.       Lumbar back: Normal.       Back:       Right hand: Normal.       Left hand: Normal.  Grimacing with sitting to lying on exam table; leaning forward when sitting in chair  Lymphadenopathy:       Head (right side): No submental, no submandibular, no tonsillar, no preauricular, no posterior auricular and no occipital adenopathy present.       Head (left side): No submental, no submandibular, no tonsillar, no preauricular, no posterior auricular and no occipital adenopathy present.    She has no cervical adenopathy.       Right cervical: No superficial cervical, no deep cervical and no posterior cervical adenopathy present.      Left  cervical: No superficial cervical, no deep cervical and no posterior cervical adenopathy present.  Neurological: She is alert and oriented to person, place, and time. She displays no atrophy and no tremor. No cranial nerve deficit or sensory deficit. She exhibits normal muscle tone. She displays no seizure activity. Coordination and gait normal. GCS eye subscore is 4. GCS verbal subscore is 5. GCS motor subscore is 6.  Skin: Skin is warm, dry and intact. No abrasion, no bruising, no burn, no ecchymosis, no laceration, no lesion, no petechiae and no rash noted. She is not diaphoretic. No cyanosis or erythema. No pallor. Nails show no clubbing.  Psychiatric: She has a normal mood and affect. Her speech is normal and behavior is normal. Judgment and thought content normal. Cognition and memory are normal.  Nursing note and vitals reviewed.   ED Course  Procedures (including critical care time)  1200 pending KUB and urinalysis results along with orthostatics.  Slight decrease in nausea after zofran oral.  1212 patient to radiology for KUB ambulatory.  Urinalysis with epithelial, trace bacteria. Discussed results with patient and CBC and CMP ordered.  Patient verbalized understanding of information/instructions, agreed with  plan of care.  1300 patient reported nausea resolved with zofran.  Stated she needs to leave now.  Discussed KUB and CBC results and negative pregnancy test with patient given copy of reports.  Refused to wait until CMP results available.  Going home to sleep.  Given strainer to strain all urine follow up in 72 hours if still having flank pain and no stone passed.  Start flomax 0.4mg  po daily.  Tylenol 1000mg  po QID prn pain.  Patient verbalized understanding of information/instructions and had no further questions at this time.  1326 patient asked nurse for home pain medications and work note excuse x 48 hours given to patient. Spoke with patient she wants pain pills to take at home.   Unsure of what she has taken in the past without causing ulcerative colitis to flare.  Discussed vicodin can have cross reactivity  And vomiting as codeine did for her in the past.  Patient unsure of what medications she has been able to take.  Stated she has not received any narcotics in the past year.  Will discuss patient case with Dr Thurmond ButtsWade.  Reviewed [facility] Controlled Substances website.  Patient with adverse effects tramadol and codeine per Epic.  See copied information below Rx received in past year.   Patient stated she has never received hydrocodone Rx this year and is going to contact [facility] in [place] as someone may have stolen her identity.  Discussed with patient that I review website for all patients in which I am considering controlled substance prescriptions.  Concern for interactions between controlled substances could result in LOC or extreme drowsiness.  Patient reported she is not taking tramadol or ambien at this time but has received prescriptions for them.  Discussed with patient she may continue tylenol 1000mg  po QID and/or trial of toradol po 10mg  max 40mg  per day recommended lowest dose that gives relief.  Patient very upset.  Patient refused offer of pain medications in clinic and wants pain medications to use at home in case she has trouble sleeping.  Patient notified of CMP results and given copy of report renal function and electrolytes normal.  Go to ER if no improvement or worsening of symptoms.  Consider Ct scan not available at our facility today.  Patient verbalized understanding of information/instructions and had no further questions at this time.  12/26/2014 12/26/2014 TRAMADOL HCL 50 MG TABLET [id number] 30 8 0 0 [id number] PASSAN[place]TE MARY L PA-C [place], [place] [id number] [place] [facility] [place], [place] Seidel, Amorita December 20, 1975 [address] [place], [place] [id number] 01 18.75 10/16/2014 09/17/2014 ZOLPIDEM TARTRATE 5 MG TABLET [id number] 30 30  0 4 [id number] [address] VANCE JR [place] , [place] [id number] [facility] [place], [place], Sherisse December 20, 1975 [address] [place], [place] [id number] 04 0 09/17/2014 09/17/2014 ZOLPIDEM TARTRATE 5 MG TABLET [id number] 30 30 0 5 [id number] [address] VANCE JR M [place], [place] [id number] [facility] [place], [place], Aniqua December 20, 1975 [address] [place], [place] [id number] 04 0 07/31/2014 07/31/2014 TRAMADOL HCL 50 MG TABLET [id number] 30 15 0 0 [id number] MATHUR SAMEER [place], [place] [id number] [facility] [place], [place], Jahliyah December 20, 1975 [address] [place], [place] [id number] 02 10 07/30/2014 07/27/2014 HYDROCODON- ACETAMINOPHEN 5- 325 [id number] 12 2 0 0 [id number] FOY DALE A PA [place], [place] [id number] [facility] [place], Jarratt Akopyan, Deetya December 20, 1975 [address] [place], [place] [id number] 04 0 07/16/2014 07/16/2014 HYDROCODON- ACETAMINOPHEN 5- 325 [id number] 12 2  0 0 [id number] FOY DALE A PA [place], [place] [id number] [facility] [place], [place] Britz, Lynnmarie 1975-10-11 [address] [place], [place] [id number] 04 0 06/01/2014 04/26/2014 CLONAZEPAM 0.5 MG TABLET [id number] 30 30 1 1  [id number] [facility] [place], [place] [id number] [facility] [place], [place] Tarlton, Yarixa 08-23-1975 [address] [place], [place] [id number] 04 0 04/29/2014 04/26/2014 CLONAZEPAM 0.5 MG TABLET [id number] 30 30 0 1 [id number] [facility], [place] [id number] [facility] [place], [place] Pompey, Normalee 07-25-1975 [address] [place], [place] [id number] 04 0 03/27/2014 03/27/2014 CLONAZEPAM 0.5 MG TABLET [id number] 30 30 0 0 [address] [place], [place] [id number] [facility] [place], [place] Rhyne, Jesiah 06-17-1975 [address] [place], [place] [id number] 04 0 03/06/2014 03/06/2014 HYDROCODON- ACETAMINOPHEN 5- 325 [id number] 12 2 0 0 [id number] HARTUNG JENNIFER D [place], [place] [id number] [facility] [place], [place] Stetson, Alexa Nov 10, 1975 [address] [place], [place] [id number] 04 0 02/25/2014 01/19/2014  CLONAZEPAM 0.5 MG TABLET [id number] 30 30 1 1  [id number] [facility], [place] [id number] [facility] [place], [place] Pau, Mellany October 07, 1975 [address] [place], [place] [id number] 04 0 02/01/2014 02/01/2014 TEMAZEPAM 7.5 MG CAPSULE [id number] 30 30 0 1 [id number] CHACHU KAREN A MD [place], [place] [id number] [facility] [place], [place]ron[place], Hennie October 30, 1975 [address] [place], [place] [id number] 04 0 01/20/2014 01/19/2014 CLONAZEPAM 0.5 MG TABLET [id number] 30 30 0 1 [id number] [facility] [place], [place] [id number] [facility] [place], [place] Ging, Harlea 01-05-1976 [address] [place],  [id number] 04 0   Labs Review Labs Reviewed  URINALYSIS COMPLETEWITH MICROSCOPIC ([facility] ONLY) - Abnormal; Notable for the following:    Specific Gravity, Urine >1.030 (*)    Squamous Epithelial / LPF 6-30 (*)    All other components within normal limits  COMPREHENSIVE METABOLIC PANEL - Abnormal; Notable for the following:    Glucose, Bld 103 (*)    All other components within normal limits  PREGNANCY, URINE  CBC WITH DIFFERENTIAL/PLATELET    Imaging Review Dg Abd 1 View  01/31/2015  CLINICAL DATA:  Right flank pain. History kidney stones. Nausea and vomiting. EXAM: ABDOMEN - 1 VIEW COMPARISON:  None. FINDINGS: Single supine view abdomen pelvis. Large ascending colonic stool burden. No abnormal abdominal calcifications. No appendicolith. Probable phleboliths in the pelvis. Distal gas in stool identified. IMPRESSION: No plain film evidence of urinary tract calculi. Possible constipation. Electronically Signed   By: Jeronimo Greaves M.D.   On: 01/31/2015 12:45   Orthostatic VS for the past 24 hrs:  BP- Lying Pulse- Lying BP- Sitting Pulse- Sitting BP- Standing at 0 minutes Pulse- Standing at 0 minutes  01/31/15 1201 113/80 mmHg 63 111/73 mmHg 73 96/72 mmHg 72      MDM   1. Right flank pain    Patient wants to only use tylenol 1000mg  po QID prn pain.  Urology apt to be scheduled if no stone passed in next 3  days.  Strain all urine.  Hydrate, hydrate.   Flomax 0.4mg  daily as directed.  zofran 4mg  ODT po q6h prn nausea/vomiting. Patient is also to push fluids and strain urine. Call or return to clinic as needed if these symptoms worsen or fail to improve as anticipated e.g. gross hematuria, fever, worsening pain, unable to void.  Exitcare handout on nephrolithiasis and flank pain given to patient.  Patient verbalized agreement and understanding of treatment plan and had no further questions at this time. P2:  Hydrate, post  coital urination, and cranberry juice   Barbaraann Barthel, NP 01/31/15 1405

## 2015-01-31 NOTE — Discharge Instructions (Signed)
Abdominal Pain, Adult °Many things can cause abdominal pain. Usually, abdominal pain is not caused by a disease and will improve without treatment. It can often be observed and treated at home. Your health care provider will do a physical exam and possibly order blood tests and X-rays to help determine the seriousness of your pain. However, in many cases, more time must pass before a clear cause of the pain can be found. Before that point, your health care provider may not know if you need more testing or further treatment. °HOME CARE INSTRUCTIONS °Monitor your abdominal pain for any changes. The following actions may help to alleviate any discomfort you are experiencing: °· Only take over-the-counter or prescription medicines as directed by your health care provider. °· Do not take laxatives unless directed to do so by your health care provider. °· Try a clear liquid diet (broth, tea, or water) as directed by your health care provider. Slowly move to a bland diet as tolerated. °SEEK MEDICAL CARE IF: °· You have unexplained abdominal pain. °· You have abdominal pain associated with nausea or diarrhea. °· You have pain when you urinate or have a bowel movement. °· You experience abdominal pain that wakes you in the night. °· You have abdominal pain that is worsened or improved by eating food. °· You have abdominal pain that is worsened with eating fatty foods. °· You have a fever. °SEEK IMMEDIATE MEDICAL CARE IF: °· Your pain does not go away within 2 hours. °· You keep throwing up (vomiting). °· Your pain is felt only in portions of the abdomen, such as the right side or the left lower portion of the abdomen. °· You pass bloody or black tarry stools. °MAKE SURE YOU: °· Understand these instructions. °· Will watch your condition. °· Will get help right away if you are not doing well or get worse. °  °This information is not intended to replace advice given to you by your health care provider. Make sure you discuss  any questions you have with your health care provider. °  °Document Released: 01/07/2005 Document Revised: 12/19/2014 Document Reviewed: 12/07/2012 °Elsevier Interactive Patient Education ©2016 [employer] ° °Flank Pain °Flank pain refers to pain that is located on the side of the body between the upper abdomen and the back. The pain may occur over a short period of time (acute) or may be long-term or reoccurring (chronic). It may be mild or severe. Flank pain can be caused by many things. °CAUSES  °Some of the more common causes of flank pain include: °· Muscle strains.   °· Muscle spasms.   °· A disease of your spine (vertebral disk disease).   °· A lung infection (pneumonia).   °· Fluid around your lungs (pulmonary edema).   °· A kidney infection.   °· Kidney stones.   °· A very painful skin rash caused by the chickenpox virus (shingles).   °· Gallbladder disease.   °HOME CARE INSTRUCTIONS  °Home care will depend on the cause of your pain. In general, °· Rest as directed by your caregiver. °· Drink enough fluids to keep your urine clear or pale yellow. °· Only take over-the-counter or prescription medicines as directed by your caregiver. Some medicines may help relieve the pain. °· Tell your caregiver about any changes in your pain. °· Follow up with your caregiver as directed. °SEEK IMMEDIATE MEDICAL CARE IF:  °· Your pain is not controlled with medicine.   °· You have new or worsening symptoms. °· Your pain increases.   °· You have abdominal   pain.   You have shortness of breath.   You have persistent nausea or vomiting.   You have swelling in your abdomen.   You feel faint or pass out.   You have blood in your urine.  You have a fever or persistent symptoms for more than 2-3 days.  You have a fever and your symptoms suddenly get worse. MAKE SURE YOU:   Understand these instructions.  Will watch your condition.  Will get help right away if you are not doing well or get worse.   This  information is not intended to replace advice given to you by your health care provider. Make sure you discuss any questions you have with your health care provider.   Document Released: 05/21/2005 Document Revised: 12/23/2011 Document Reviewed: 11/12/2011 Elsevier Interactive Patient Education 2016 [employer] Inc[employer] Kidney Stones Kidney stones (urolithiasis) are deposits that form inside your kidneys. The intense pain is caused by the stone moving through the urinary tract. When the stone moves, the ureter goes into spasm around the stone. The stone is usually passed in the urine.  CAUSES   A disorder that makes certain neck glands produce too much parathyroid hormone (primary hyperparathyroidism).  A buildup of uric acid crystals, similar to gout in your joints.  Narrowing (stricture) of the ureter.  A kidney obstruction present at birth (congenital obstruction).  Previous surgery on the kidney or ureters.  Numerous kidney infections. SYMPTOMS   Feeling sick to your stomach (nauseous).  Throwing up (vomiting).  Blood in the urine (hematuria).  Pain that usually spreads (radiates) to the groin.  Frequency or urgency of urination. DIAGNOSIS   Taking a history and physical exam.  Blood or urine tests.  CT scan.  Occasionally, an examination of the inside of the urinary bladder (cystoscopy) is performed. TREATMENT   Observation.  Increasing your fluid intake.  Extracorporeal shock wave lithotripsy--This is a noninvasive procedure that uses shock waves to break up kidney stones.  Surgery may be needed if you have severe pain or persistent obstruction. There are various surgical procedures. Most of the procedures are performed with the use of small instruments. Only small incisions are needed to accommodate these instruments, so recovery time is minimized. The size, location, and chemical composition are all important variables that will determine the proper choice of action  for you. Talk to your health care provider to better understand your situation so that you will minimize the risk of injury to yourself and your kidney.  HOME CARE INSTRUCTIONS   Drink enough water and fluids to keep your urine clear or pale yellow. This will help you to pass the stone or stone fragments.  Strain all urine through the provided strainer. Keep all particulate matter and stones for your health care provider to see. The stone causing the pain may be as small as a grain of salt. It is very important to use the strainer each and every time you pass your urine. The collection of your stone will allow your health care provider to analyze it and verify that a stone has actually passed. The stone analysis will often identify what you can do to reduce the incidence of recurrences.  Only take over-the-counter or prescription medicines for pain, discomfort, or fever as directed by your health care provider.  Keep all follow-up visits as told by your health care provider. This is important.  Get follow-up X-rays if required. The absence of pain does not always mean that the stone has passed. It  may have only stopped moving. If the urine remains completely obstructed, it can cause loss of kidney function or even complete destruction of the kidney. It is your responsibility to make sure X-rays and follow-ups are completed. Ultrasounds of the kidney can show blockages and the status of the kidney. Ultrasounds are not associated with any radiation and can be performed easily in a matter of minutes.  Make changes to your daily diet as told by your health care provider. You may be told to:  Limit the amount of salt that you eat.  Eat 5 or more servings of fruits and vegetables each day.  Limit the amount of meat, poultry, fish, and eggs that you eat.  Collect a 24-hour urine sample as told by your health care provider.You may need to collect another urine sample every 6-12 months. SEEK MEDICAL  CARE IF:  You experience pain that is progressive and unresponsive to any pain medicine you have been prescribed. SEEK IMMEDIATE MEDICAL CARE IF:   Pain cannot be controlled with the prescribed medicine.  You have a fever or shaking chills.  The severity or intensity of pain increases over 18 hours and is not relieved by pain medicine.  You develop a new onset of abdominal pain.  You feel faint or pass out.  You are unable to urinate.   This information is not intended to replace advice given to you by your health care provider. Make sure you discuss any questions you have with your health care provider.   Document Released: 03/30/2005 Document Revised: 12/19/2014 Document Reviewed: 08/31/2012 Elsevier Interactive Patient Education 2016 [employer] Dietary Guidelines to Help Prevent Kidney Stones Your risk of kidney stones can be decreased by adjusting the foods you eat. The most important thing you can do is drink enough fluid. You should drink enough fluid to keep your urine clear or pale yellow. The following guidelines provide specific information for the type of kidney stone you have had. GUIDELINES ACCORDING TO TYPE OF KIDNEY STONE Calcium Oxalate Kidney Stones  Reduce the amount of salt you eat. Foods that have a lot of salt cause your body to release excess calcium into your urine. The excess calcium can combine with a substance called oxalate to form kidney stones.  Reduce the amount of animal protein you eat if the amount you eat is excessive. Animal protein causes your body to release excess calcium into your urine. Ask your dietitian how much protein from animal sources you should be eating.  Avoid foods that are high in oxalates. If you take vitamins, they should have less than 500 mg of vitamin C. Your body turns vitamin C into oxalates. You do not need to avoid fruits and vegetables high in vitamin C. Calcium Phosphate Kidney Stones  Reduce the amount of salt you  eat to help prevent the release of excess calcium into your urine.  Reduce the amount of animal protein you eat if the amount you eat is excessive. Animal protein causes your body to release excess calcium into your urine. Ask your dietitian how much protein from animal sources you should be eating.  Get enough calcium from food or take a calcium supplement (ask your dietitian for recommendations). Food sources of calcium that do not increase your risk of kidney stones include:  Broccoli.  Dairy products, such as cheese and yogurt.  Pudding. Uric Acid Kidney Stones  Do not have more than 6 oz of animal protein per day. FOOD SOURCES Animal Protein Sources  Meat (all types).  Poultry.  Eggs.  Fish, seafood. Foods High in MirantSalt  Salt seasonings.  Soy sauce.  Teriyaki sauce.  Cured and processed meats.  Salted crackers and snack foods.  Fast food.  Canned soups and most canned foods. Foods High in Oxalates  Grains:  Amaranth.  Barley.  Grits.  Wheat germ.  Bran.  Buckwheat flour.  All bran cereals.  Pretzels.  Whole wheat bread.  Vegetables:  Beans (wax).  Beets and beet greens.  Collard greens.  Eggplant.  Escarole.  Leeks.  Okra.  Parsley.  Rutabagas.  Spinach.  Swiss chard.  Tomato paste.  Fried potatoes.  Sweet potatoes.  Fruits:  Red currants.  Figs.  Kiwi.  Rhubarb.  Meat and Other Protein Sources:  Beans (dried).  Soy burgers and other soybean products.  Miso.  Nuts (peanuts, almonds, pecans, cashews, hazelnuts).  Nut butters.  Sesame seeds and tahini (paste made of sesame seeds).  Poppy seeds.  Beverages:  Chocolate drink mixes.  Soy milk.  Instant iced tea.  Juices made from high-oxalate fruits or vegetables.  Other:  Carob.  Chocolate.  Fruitcake.  Marmalades.   This information is not intended to replace advice given to you by your health care provider. Make sure you discuss  any questions you have with your health care provider.   Document Released: 07/25/2010 Document Revised: 04/04/2013 Document Reviewed: 02/24/2013 Elsevier Interactive Patient Education [employer] Inc[employer]

## 2015-02-03 LAB — URINE CULTURE

## 2016-06-16 IMAGING — CR DG ABDOMEN 1V
2 series · 2 of 2 positions shown · non-contrast
Comparison: None.

CLINICAL DATA: Right flank pain. History kidney stones. Nausea and
vomiting.

EXAM:
ABDOMEN - 1 VIEW

[abdomen kub (1 of 2)]
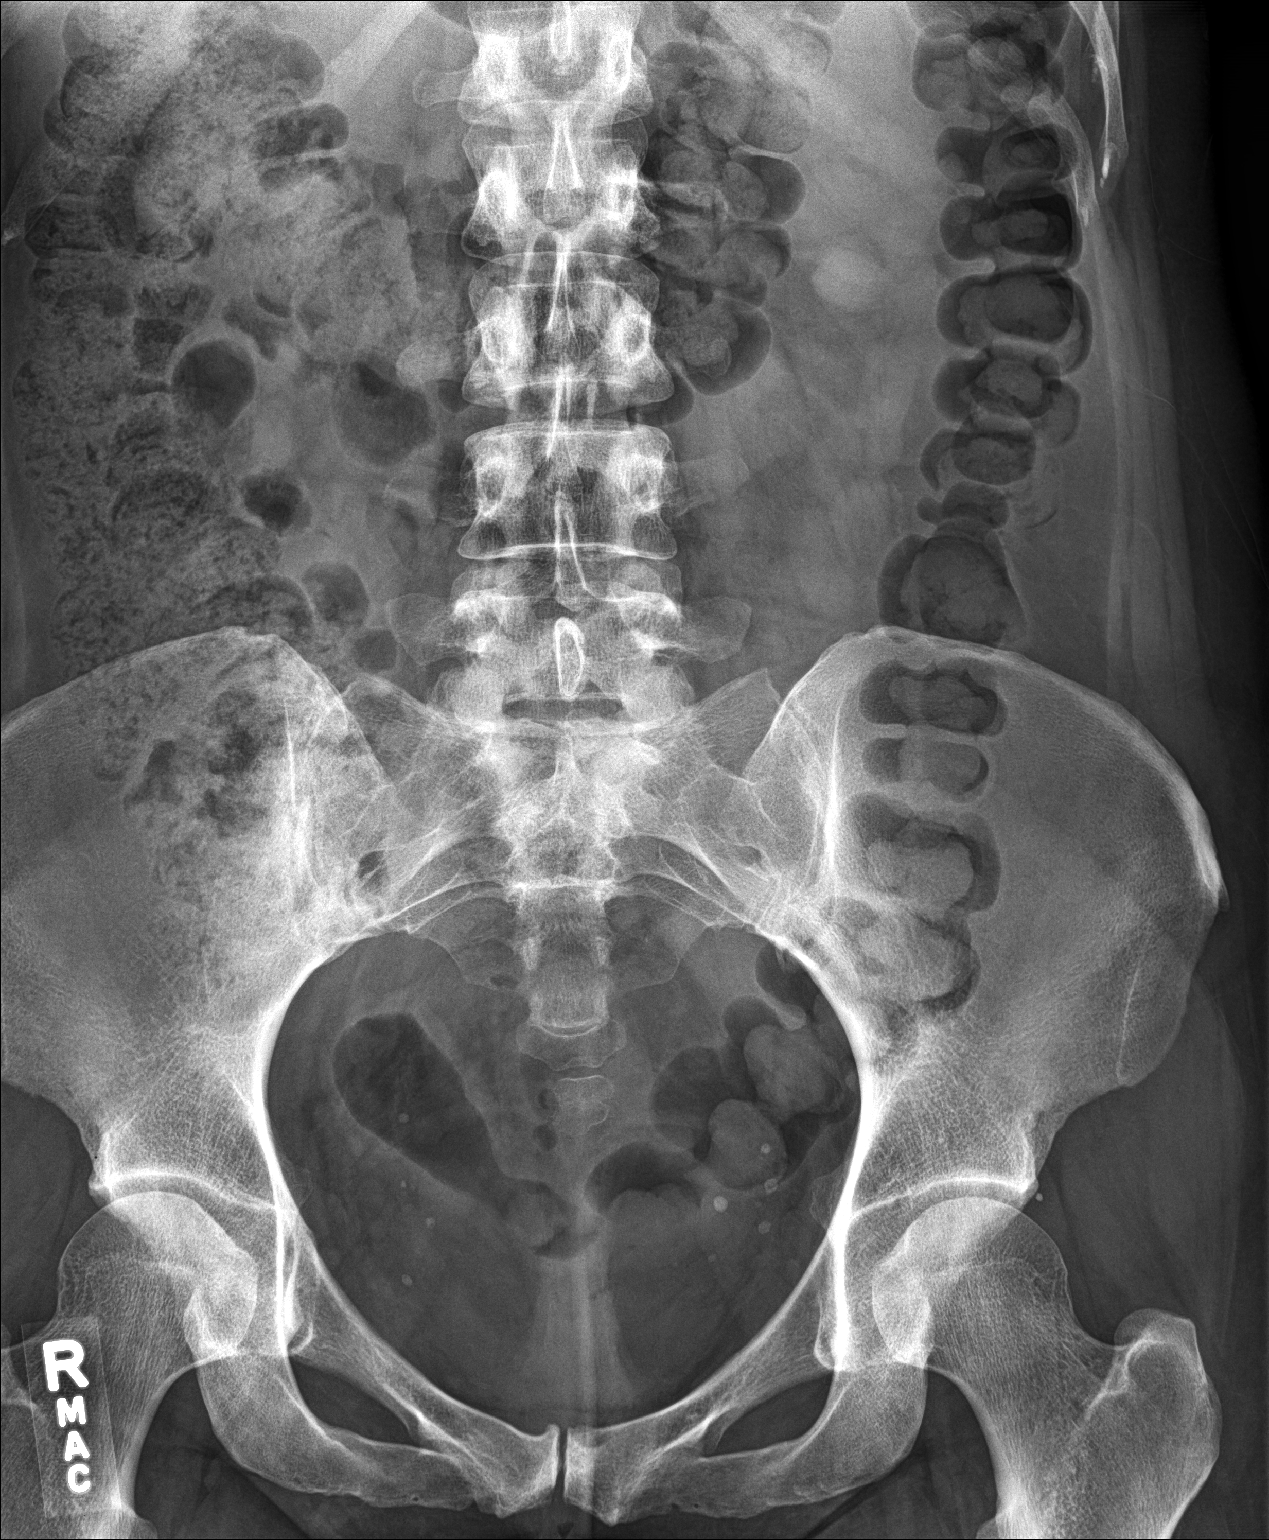

[abdomen kub (2 of 2)]
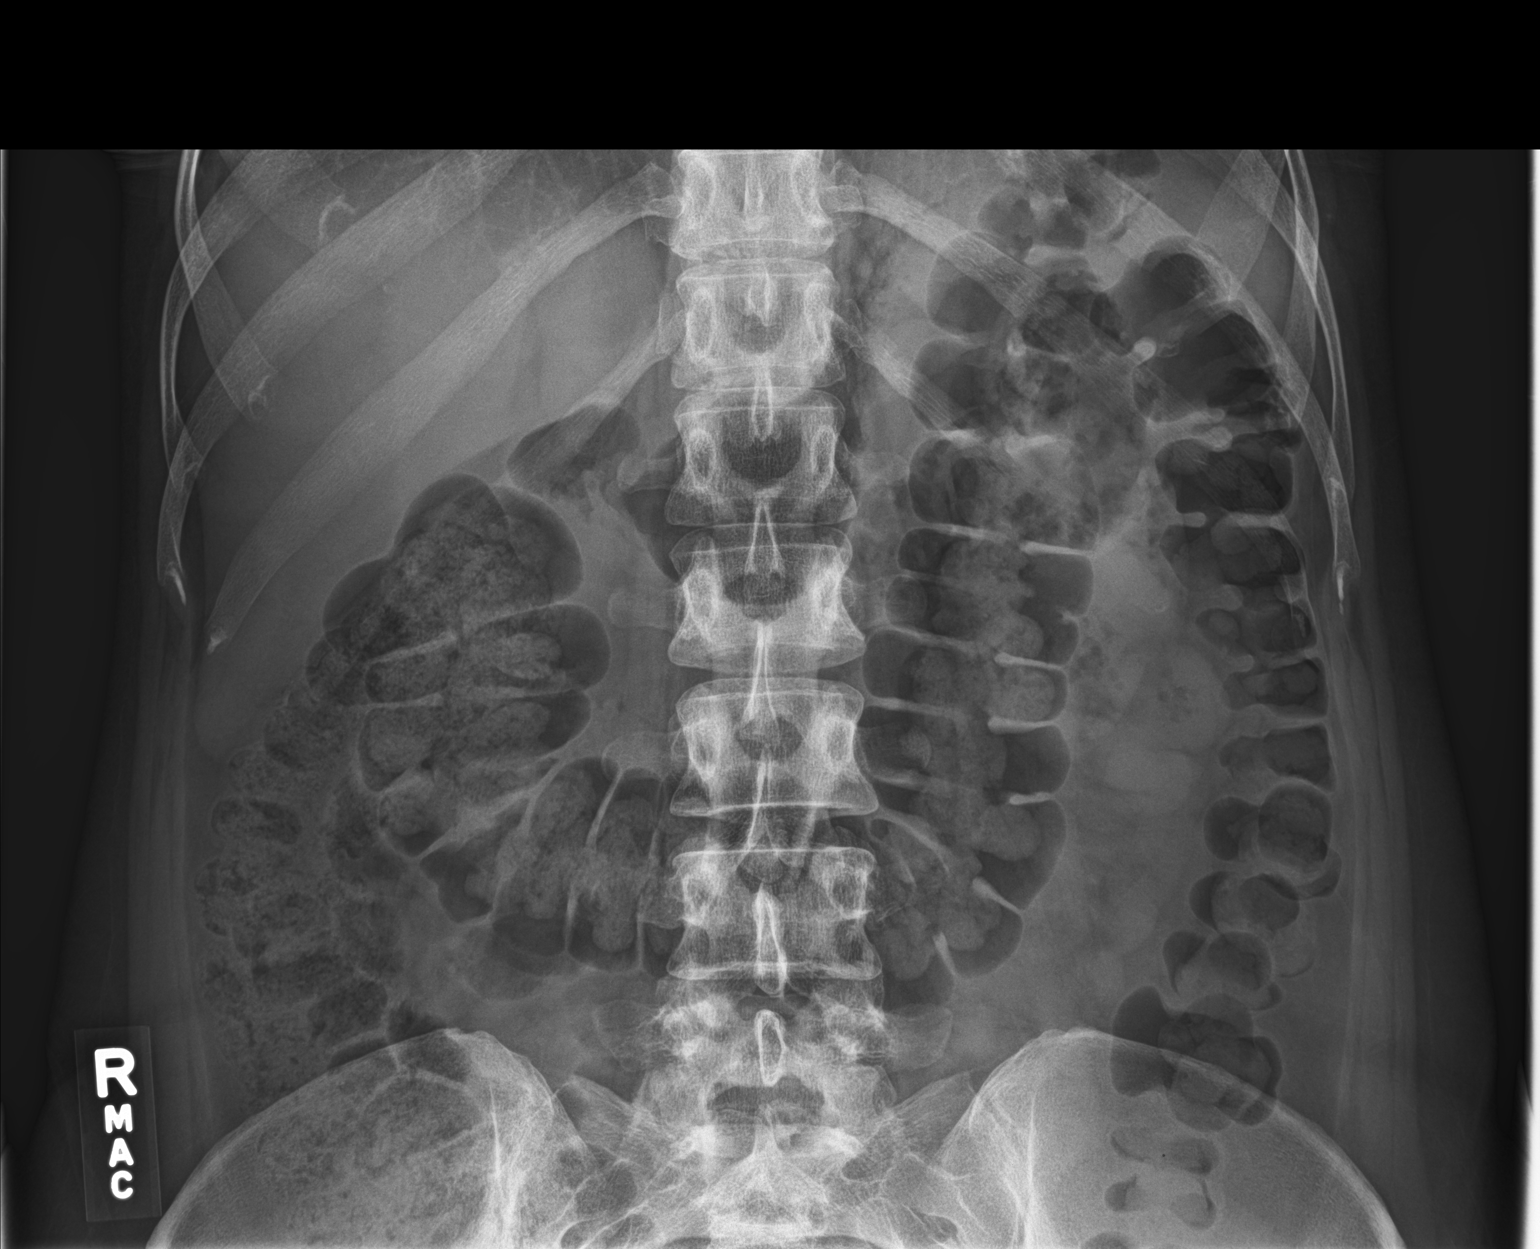

[2 of 2 positions shown; findings below may reference images not displayed]

FINDINGS: Single supine view abdomen pelvis. Large ascending colonic stool
burden. No abnormal abdominal calcifications. No appendicolith.
Probable phleboliths in the pelvis. Distal gas in stool identified.
IMPRESSION: No plain film evidence of urinary tract calculi.

Possible constipation.
